# Patient Record
Sex: Male | Born: 1990 | Race: Black or African American | Hispanic: No | Marital: Single | Smoking: Current every day smoker
Health system: Southern US, Community
[De-identification: ages and names within clinical notes are randomized; demographics above are authoritative.]

---

## 2014-11-02 ENCOUNTER — Emergency Department: Payer: Self-pay | Admitting: Emergency Medicine

## 2015-01-25 ENCOUNTER — Ambulatory Visit: Payer: Self-pay

## 2015-02-05 ENCOUNTER — Emergency Department
Admission: EM | Admit: 2015-02-05 | Discharge: 2015-02-05 | Disposition: A | Discharge status: Home / Self Care | Payer: Self-pay | Attending: Emergency Medicine | Admitting: Emergency Medicine

## 2015-02-05 DIAGNOSIS — L0201 Cutaneous abscess of face: Secondary | ICD-10-CM | POA: Insufficient documentation

## 2015-02-05 DIAGNOSIS — L0291 Cutaneous abscess, unspecified: Secondary | ICD-10-CM

## 2015-02-05 MED ORDER — LIDOCAINE-EPINEPHRINE (PF) 1 %-1:200000 IJ SOLN
INTRAMUSCULAR | Status: AC
  Filled 2015-02-05: qty 30

## 2015-02-05 MED ORDER — LIDOCAINE-EPINEPHRINE (PF) 1 %-1:200000 IJ SOLN: 10.0000 mL | Freq: Once | INTRAMUSCULAR | Status: DC

## 2015-02-05 NOTE — ED Notes (Addendum)
Pt reports waking up with bump on left side of neck about 4 hours ago.  Pt reports throbbing pain to area.  Pt denies any difficulty breathing.  Pt denies any past abscesses before.  Area on left neck visibly raised with pustule at apex.  No drainage noted.  Abscess about 2" in diameter when palpated.  Pt reports tenderness upon palpation. Pt NAD at this time.

## 2015-02-05 NOTE — ED Provider Notes (Signed)
[facility] Emergency Department Provider Note    ____________________________________________  Time seen: [id number]  I have reviewed the triage vital signs and the nursing notes.   HISTORY  Chief Complaint Abscess   History limited by: Not Limited   HPI Aaron Green is a 24 y.o. male who presents to the emergency department because of pain and swelling to his left cheek. The patient states that this started last night and has grown progressively worse in size and pain. Patient denies any drainage. Denies any similar symptoms in the past. Denies any recent fevers. Denies any difficulty with breathing or swallowing.     No past medical history on file.  There are no active problems to display for this patient.   No past surgical history on file.  No current outpatient prescriptions on file.  Allergies Review of patient's allergies indicates no known allergies.  No family history on file.  Social History History  Substance Use Topics  . Smoking status: Not on file  . Smokeless tobacco: Not on file  . Alcohol Use: Not on file    Review of Systems  Constitutional: Negative for fever. Cardiovascular: Negative for chest pain. Respiratory: Negative for shortness of breath. Gastrointestinal: Negative for abdominal pain, vomiting and diarrhea. Genitourinary: Negative for dysuria. Musculoskeletal: Negative for back pain. Skin: Negative for rash. Bump and pain to left cheek.  Neurological: Negative for headaches, focal weakness or numbness.   10-point ROS otherwise negative.  ____________________________________________   PHYSICAL EXAM:  VITAL SIGNS: ED Triage Vitals  Enc Vitals Group     BP 02/05/15 0310 141/73 mmHg     Pulse Rate 02/05/15 0310 74     Resp 02/05/15 0310 18     Temp 02/05/15 0310 98.9 F (37.2 C)     Temp Source 02/05/15 0310 Oral     SpO2 02/05/15 0310 99 %     Weight 02/05/15 0310 165 lb (74.844 kg)     Height  02/05/15 0310 5\' 6"  (1.676 m)     Head Cir --      Peak Flow --      Pain Score 02/05/15 0311 6   Constitutional: Alert and oriented. Well appearing and in no distress. Eyes: Conjunctivae are normal. PERRL. Normal extraocular movements. ENT   Head: Normocephalic and atraumatic. Swelling and tenderness to the left cheek roughly around the angle of mandible. There is a pustule at the apex. No active drainage.   Nose: No congestion/rhinnorhea.   Mouth/Throat: Mucous membranes are moist.   Neck: No stridor. Hematological/Lymphatic/Immunilogical: No cervical lymphadenopathy. Cardiovascular: Normal rate Respiratory: Normal respiratory effort without tachypnea nor retractions.  Genitourinary: Deferred Musculoskeletal: Normal range of motion in all extremities. No joint effusions.  No lower extremity tenderness nor edema. Neurologic:  Normal speech and language. No gross focal neurologic deficits are appreciated. Speech is normal.  Skin:  Skin is warm, dry and intact.  Psychiatric: Mood and affect are normal. Speech and behavior are normal. Patient exhibits appropriate insight and judgment.  ____________________________________________    LABS (pertinent positives/negatives)  None  ____________________________________________   EKG  None  ____________________________________________    RADIOLOGY  None  ____________________________________________   PROCEDURES  Procedure(s) performed: I&D, see procedure note(s).  Critical Care performed: No   Incision and Drainage of Abcess Anesthesia Local: 1% Lidocaine with Epi  Prep/Procedure: Skin Prep: Chlorahex Incised abscess with #11 blade Purulent discharge: small Probed to break up loculations Packed with 1/4" gauze Estimated blood loss: <1 ml  ____________________________________________   INITIAL IMPRESSION / ASSESSMENT AND PLAN / ED COURSE  Pertinent labs & imaging results that were available  during my care of the patient were reviewed by me and considered in my medical decision making (see chart for details).  Patient here with bump and swelling to left cheek roughly around the angle of the mandible. Physical exam is consistent with an abscess.  Patient underwent I and D with small amount of purulent discharge. Instructed patient to return in 24 hours for wound recheck.   ____________________________________________   FINAL CLINICAL IMPRESSION(S) / ED DIAGNOSES  Final diagnoses:  Abscess     Phineas SemenGraydon Wolfgang Finigan, MD 02/05/15 [phone]

## 2015-02-05 NOTE — Discharge Instructions (Signed)
In my judgment, in view of the above findings, the patient has a reassuring evaluation and can be safely discharged.Issues concerning treatment and diagnosis were discussed. There were no barriers to understanding. The plan of treatment explanation was well received by the patient and/or family who then verbalized understanding. Please return to the emergency department tomorrow for wound recheck.  Abscess Care After An abscess (also called a boil or furuncle) is an infected area that contains a collection of pus. Signs and symptoms of an abscess include pain, tenderness, redness, or hardness, or you may feel a moveable soft area under your skin. An abscess can occur anywhere in the body. The infection may spread to surrounding tissues causing cellulitis. A cut (incision) by the surgeon was made over your abscess and the pus was drained out. Gauze may have been packed into the space to provide a drain that will allow the cavity to heal from the inside outwards. The boil may be painful for 5 to 7 days. Most people with a boil do not have high fevers. Your abscess, if seen early, may not have localized, and may not have been lanced. If not, another appointment may be required for this if it does not get better on its own or with medications. HOME CARE INSTRUCTIONS   Only take over-the-counter or prescription medicines for pain, discomfort, or fever as directed by your caregiver.  When you bathe, soak and then remove gauze or iodoform packs at least daily or as directed by your caregiver. You may then wash the wound gently with mild soapy water. Repack with gauze or do as your caregiver directs. SEEK IMMEDIATE MEDICAL CARE IF:   You develop increased pain, swelling, redness, drainage, or bleeding in the wound site.  You develop signs of generalized infection including muscle aches, chills, fever, or a general ill feeling.  An oral temperature above 102 F (38.9 C) develops, not controlled by  medication. See your caregiver for a recheck if you develop any of the symptoms described above. If medications (antibiotics) were prescribed, take them as directed. Document Released: 03/29/2005 Document Revised: 12/03/2011 Document Reviewed: 11/24/2007 [facility] Patient Information 2015 [employer] [place]. This information is not intended to replace advice given to you by your health care provider. Make sure you discuss any questions you have with your health care provider.

## 2015-02-05 NOTE — ED Notes (Addendum)
PT with large amt of swelling to left jaw and neck, states woke up this way.  Denies any airway obstruction or shob.

## 2015-02-06 ENCOUNTER — Emergency Department
Admission: EM | Admit: 2015-02-06 | Discharge: 2015-02-06 | Disposition: A | Discharge status: Home / Self Care | Payer: Self-pay | Attending: Emergency Medicine | Admitting: Emergency Medicine

## 2015-02-06 ENCOUNTER — Encounter: Payer: Self-pay | Admitting: *Deleted

## 2015-02-06 DIAGNOSIS — IMO0001 Reserved for inherently not codable concepts without codable children: Secondary | ICD-10-CM

## 2015-02-06 DIAGNOSIS — Z720 Tobacco use: Secondary | ICD-10-CM | POA: Insufficient documentation

## 2015-02-06 DIAGNOSIS — Z4801 Encounter for change or removal of surgical wound dressing: Secondary | ICD-10-CM | POA: Insufficient documentation

## 2015-02-06 DIAGNOSIS — T814XXD Infection following a procedure, subsequent encounter: Secondary | ICD-10-CM

## 2015-02-06 MED ORDER — IBUPROFEN 800 MG PO TABS: 800.0000 mg | ORAL_TABLET | Freq: Three times a day (TID) | ORAL | Status: AC | PRN

## 2015-02-06 MED ORDER — SULFAMETHOXAZOLE-TRIMETHOPRIM 800-160 MG PO TABS: 1.0000 | ORAL_TABLET | Freq: Two times a day (BID) | ORAL | Status: AC

## 2015-02-06 MED ORDER — SULFAMETHOXAZOLE-TRIMETHOPRIM 800-160 MG PO TABS
ORAL_TABLET | ORAL | Status: AC
  Filled 2015-02-06: qty 1

## 2015-02-06 MED ORDER — SULFAMETHOXAZOLE-TRIMETHOPRIM 800-160 MG PO TABS
1.0000 | ORAL_TABLET | Freq: Once | ORAL | Status: AC
  Administered 2015-02-06: 1 via ORAL

## 2015-02-06 NOTE — ED Notes (Signed)
Pt here for abcess recheck on left side of jaw.  Pt was seen in the ER yesterday.

## 2015-02-06 NOTE — ED Provider Notes (Signed)
CSN: [id number]     Arrival date & time 02/06/15  1947 History   First MD Initiated Contact with Patient 02/06/15 2115     Chief Complaint  Patient presents with  . Wound Check     (Consider location/radiation/quality/duration/timing/severity/associated sxs/prior Treatment) HPI patient states he was seen and evaluated yesterday had an I&D landed still has swelling pain and drainage from the area and is here today for reevaluation no other complaints at this time nothing seems to make anything particularly better or worse rates his pain as approximately a 5-6 out of 10  No past medical history on file. No past surgical history on file. No family history on file. History  Substance Use Topics  . Smoking status: Current Every Day Smoker  . Smokeless tobacco: Not on file  . Alcohol Use: No    Review of Systems Negative 6 systems  Constitutional: Negative for fever. Cardiovascular: Negative for chest pain. Respiratory: Negative for shortness of breath. Gastrointestinal: Negative for abdominal pain, vomiting and diarrhea. Genitourinary: Negative for dysuria. Musculoskeletal: Negative for back pain. Skin: Negative for rash. Bump and pain to left cheek.  Neurological: Negative for headaches, focal weakness or numbness.  Allergies  Review of patient's allergies indicates no known allergies.  Home Medications   Prior to Admission medications   Medication Sig Start Date End Date Taking? Authorizing Provider  ibuprofen (ADVIL,MOTRIN) 800 MG tablet Take 1 tablet (800 mg total) by mouth every 8 (eight) hours as needed. 02/06/15   Kimara Bencomo William C Ly Bacchi, PA-C  sulfamethoxazole-trimethoprim (BACTRIM DS,SEPTRA DS) 800-160 MG per tablet Take 1 tablet by mouth 2 (two) times daily. 02/06/15   Rigel Filsinger William C Jonmichael Beadnell, PA-C   BP 128/66 mmHg  Pulse 61  Temp(Src) 97.8 F (36.6 C) (Oral)  Resp 18  Ht 5\' 6"  (1.676 m)  Wt 165 lb (74.844 kg)  BMI 26.64 kg/m2  SpO2 100% Physical Exam  male  appearing stated age well-developed well-nourished distress vitals were reviewed head ears eyes nose neck and throat shows him to have a large abscess swollen and red on his left mandibular angle Lymphatic no adenopathy Heart cardiovascular regular rate and rhythm no murmurs rubs gallops Pulmonary lungs clear to auscultation bilaterally  ED Course  Procedures (including critical care time) wound was reevaluated a large amount of PERRLA material was manually expressed wound was explored no more pus pockets wound was redressed   MDM  Decision making on this patient he had increasing redness  and and drainage around the wound large amount of PERRLA material was expressed on palpation wound was explored and redressed we'll start the patient on Bactrim as well as ibuprofen he is to return in 3 days for wound check if not improved  Final diagnoses:  Wound abscess, subsequent encounter      Akila Batta Rosalyn GessWilliam C Daryel Kenneth, PA-C 02/06/15 2132  Sharyn CreamerMark Quale, MD 02/06/15 2333

## 2015-10-18 ENCOUNTER — Ambulatory Visit: Admission: EM | Admit: 2015-10-18 | Discharge: 2015-10-18 | Discharge status: Absent without leave | Payer: Self-pay

## 2016-03-06 ENCOUNTER — Emergency Department: Payer: Self-pay

## 2016-03-06 ENCOUNTER — Emergency Department
Admission: EM | Admit: 2016-03-06 | Discharge: 2016-03-07 | Disposition: A | Discharge status: Home / Self Care | Payer: Self-pay | Attending: Emergency Medicine | Admitting: Emergency Medicine

## 2016-03-06 DIAGNOSIS — Y9301 Activity, walking, marching and hiking: Secondary | ICD-10-CM | POA: Insufficient documentation

## 2016-03-06 DIAGNOSIS — S46911A Strain of unspecified muscle, fascia and tendon at shoulder and upper arm level, right arm, initial encounter: Secondary | ICD-10-CM | POA: Insufficient documentation

## 2016-03-06 DIAGNOSIS — Y929 Unspecified place or not applicable: Secondary | ICD-10-CM | POA: Insufficient documentation

## 2016-03-06 DIAGNOSIS — F17200 Nicotine dependence, unspecified, uncomplicated: Secondary | ICD-10-CM | POA: Insufficient documentation

## 2016-03-06 DIAGNOSIS — Z79899 Other long term (current) drug therapy: Secondary | ICD-10-CM | POA: Insufficient documentation

## 2016-03-06 DIAGNOSIS — Y999 Unspecified external cause status: Secondary | ICD-10-CM | POA: Insufficient documentation

## 2016-03-06 DIAGNOSIS — Z792 Long term (current) use of antibiotics: Secondary | ICD-10-CM | POA: Insufficient documentation

## 2016-03-06 DIAGNOSIS — W010XXA Fall on same level from slipping, tripping and stumbling without subsequent striking against object, initial encounter: Secondary | ICD-10-CM | POA: Insufficient documentation

## 2016-03-06 NOTE — ED Notes (Signed)
Pt was walking on rocks and he injured his right shoulder, no other complaints.

## 2016-03-06 NOTE — ED Provider Notes (Signed)
[facility] Emergency Department Provider Note ____________________________________________  Time seen: Approximately 11:28 PM  I have reviewed the triage vital signs and the nursing notes.   HISTORY  Chief Complaint Shoulder Pain    HPI Aaron Green is a 25 y.o. male who presents to the emergency department for evaluation of right shoulder pain. He states that while walking on some rocks today about 4:00, he tripped and landed on his outstretched hand. He has had pain in the right shoulder since that time. He is right hand dominant. He denies numbness or tingling in the arm. He denies pain in the elbow, wrist, or hand.  No past medical history on file.  There are no active problems to display for this patient.   No past surgical history on file.  Current Outpatient Rx  Name  Route  Sig  Dispense  Refill  . ibuprofen (ADVIL,MOTRIN) 800 MG tablet   Oral   Take 1 tablet (800 mg total) by mouth every 8 (eight) hours as needed.   30 tablet   0   . naproxen (NAPROSYN) 500 MG tablet   Oral   Take 1 tablet (500 mg total) by mouth 2 (two) times daily with a meal.   30 tablet   0   . sulfamethoxazole-trimethoprim (BACTRIM DS,SEPTRA DS) 800-160 MG per tablet   Oral   Take 1 tablet by mouth 2 (two) times daily.   14 tablet   0     Allergies Review of patient's allergies indicates no known allergies.  No family history on file.  Social History Social History  Substance Use Topics  . Smoking status: Current Every Day Smoker  . Smokeless tobacco: Not on file  . Alcohol Use: No    Review of Systems Constitutional: No recent illness. Cardiovascular: Denies chest pain or palpitations. Respiratory: Denies shortness of breath. Musculoskeletal: Pain in Right shoulder. Skin: Negative for rash, wound, lesion. Neurological: Negative for focal weakness or numbness.  ____________________________________________   PHYSICAL EXAM:  VITAL SIGNS: ED  Triage Vitals  Enc Vitals Group     BP 03/06/16 2220 134/66 mmHg     Pulse Rate 03/06/16 2220 72     Resp 03/06/16 2220 18     Temp 03/06/16 2219 98.6 F (37 C)     Temp Source 03/06/16 2219 Oral     SpO2 03/06/16 2220 98 %     Weight 03/06/16 2219 165 lb (74.844 kg)     Height 03/06/16 2219 5\' 8"  (1.727 m)     Head Cir --      Peak Flow --      Pain Score 03/06/16 2219 8     Pain Loc --      Pain Edu? --      Excl. in GC? --     Constitutional: Alert and oriented. Well appearing and in no acute distress. Eyes: Conjunctivae are normal. EOMI. Head: Atraumatic. Neck: No stridor.  Respiratory: Normal respiratory effort.   Musculoskeletal: Limited range of motion of the right shoulder secondary to pain. Full range of motion of the right elbow, wrist, and hand including all fingers. Tenderness over the proximal humerus and deltoid. Neurologic:  Normal speech and language. No gross focal neurologic deficits are appreciated. Speech is normal. No gait instability. Skin:  Skin is warm, dry and intact. Atraumatic. Psychiatric: Mood and affect are normal. Speech and behavior are normal.  ____________________________________________   LABS (all labs ordered are listed, but only abnormal results are  displayed)  Labs Reviewed - No data to display ____________________________________________  RADIOLOGY  No acute bony abnormality per radiology.  I, Kem Boroughs, personally viewed and evaluated these images (plain radiographs) as part of my medical decision making, as well as reviewing the written report by the radiologist.   ____________________________________________   PROCEDURES  Procedure(s) performed:  Sling applied by ER tech. Patient was neurovascularly intact post-application.   ____________________________________________   INITIAL IMPRESSION / ASSESSMENT AND PLAN / ED COURSE  Pertinent labs & imaging results that were available during my care of the patient were  reviewed by me and considered in my medical decision making (see chart for details).  Patient is to follow-up with orthopedics for symptoms that are not improving over the week. He was instructed to return to emergency room for symptoms that change or worsen. He is given instruction on wearing the sling. He is to wear it while out of bed. He is to remove it while at rest. ____________________________________________   FINAL CLINICAL IMPRESSION(S) / ED DIAGNOSES  Final diagnoses:  Shoulder strain, right, initial encounter       Chinita Pester, FNP 03/07/16 0015  Jennye Moccasin, MD 03/07/16 0030

## 2016-03-07 MED ORDER — NAPROXEN 500 MG PO TABS
500.0000 mg | ORAL_TABLET | Freq: Once | ORAL | Status: AC
  Administered 2016-03-07: 500 mg via ORAL
  Filled 2016-03-07: qty 1

## 2016-03-07 MED ORDER — NAPROXEN 500 MG PO TABS: 500.0000 mg | ORAL_TABLET | Freq: Two times a day (BID) | ORAL | Status: AC

## 2017-09-05 IMAGING — CR DG SHOULDER 2+V*R*
1 series · 3 of 3 positions shown · non-contrast
Comparison: None.

CLINICAL DATA: Status post fall onto outstretched hand, with right
shoulder injury. Initial encounter.

EXAM:
RIGHT SHOULDER - 2+ VIEW

[Series 1: w shoulder external right · 0.14mm/px · 3 of 3 slices shown]
[im 1/3]
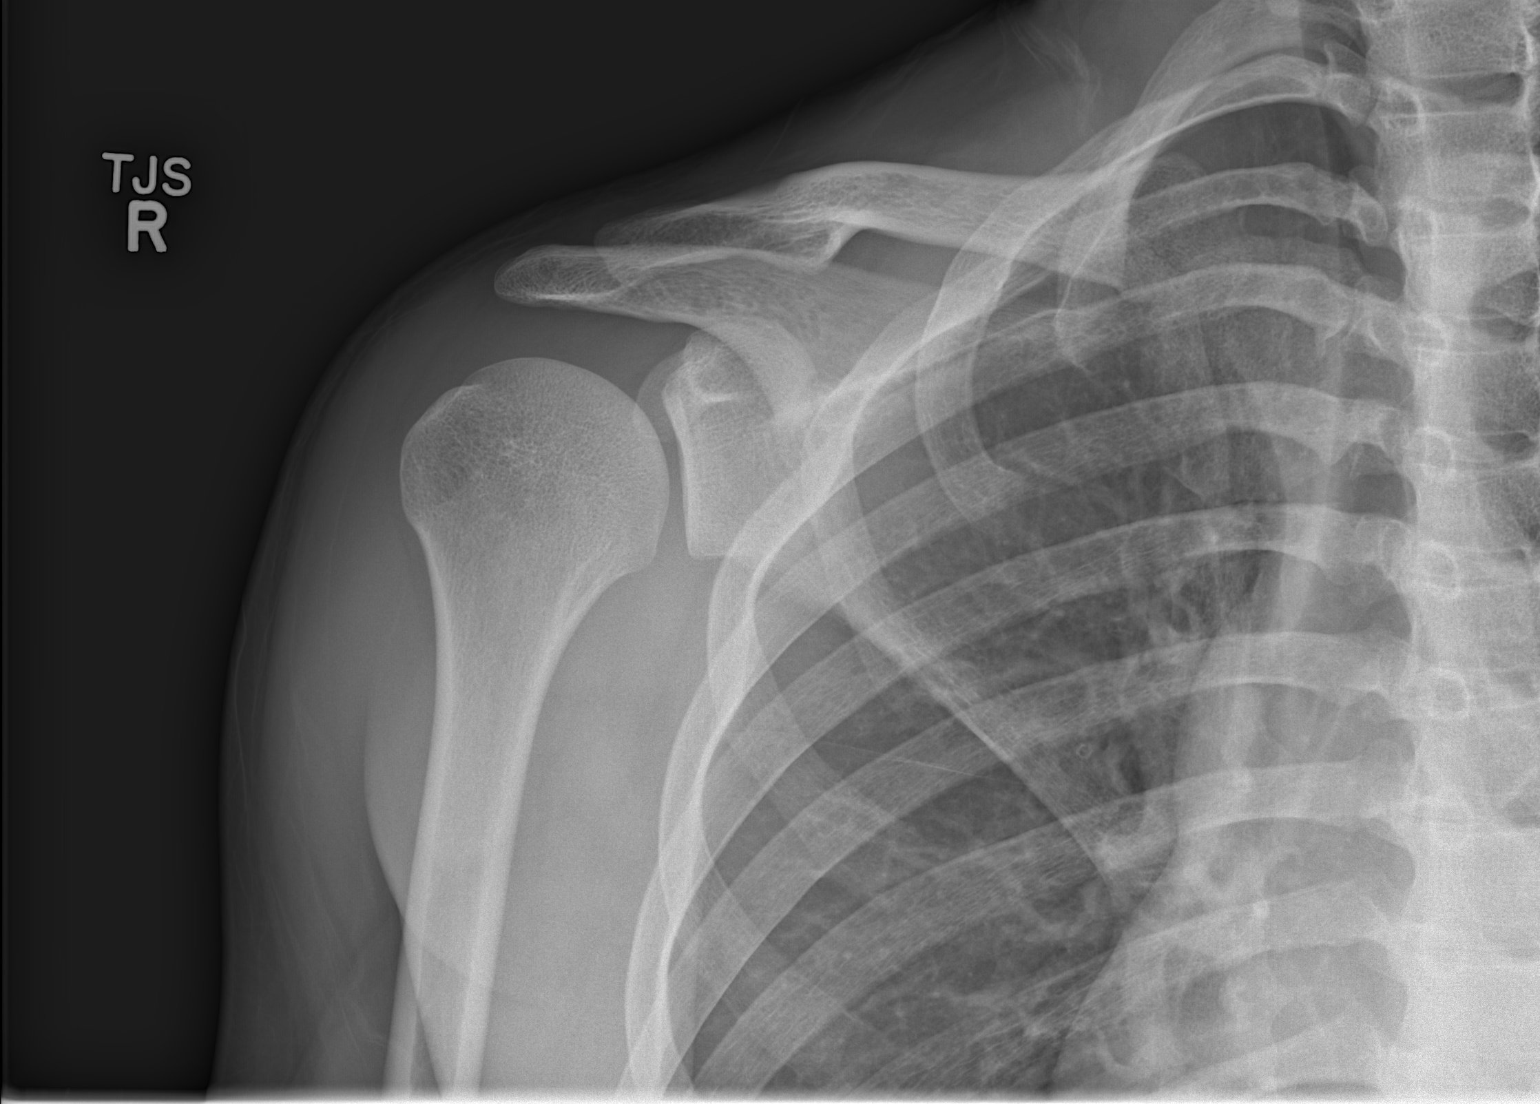
[im 2/3]
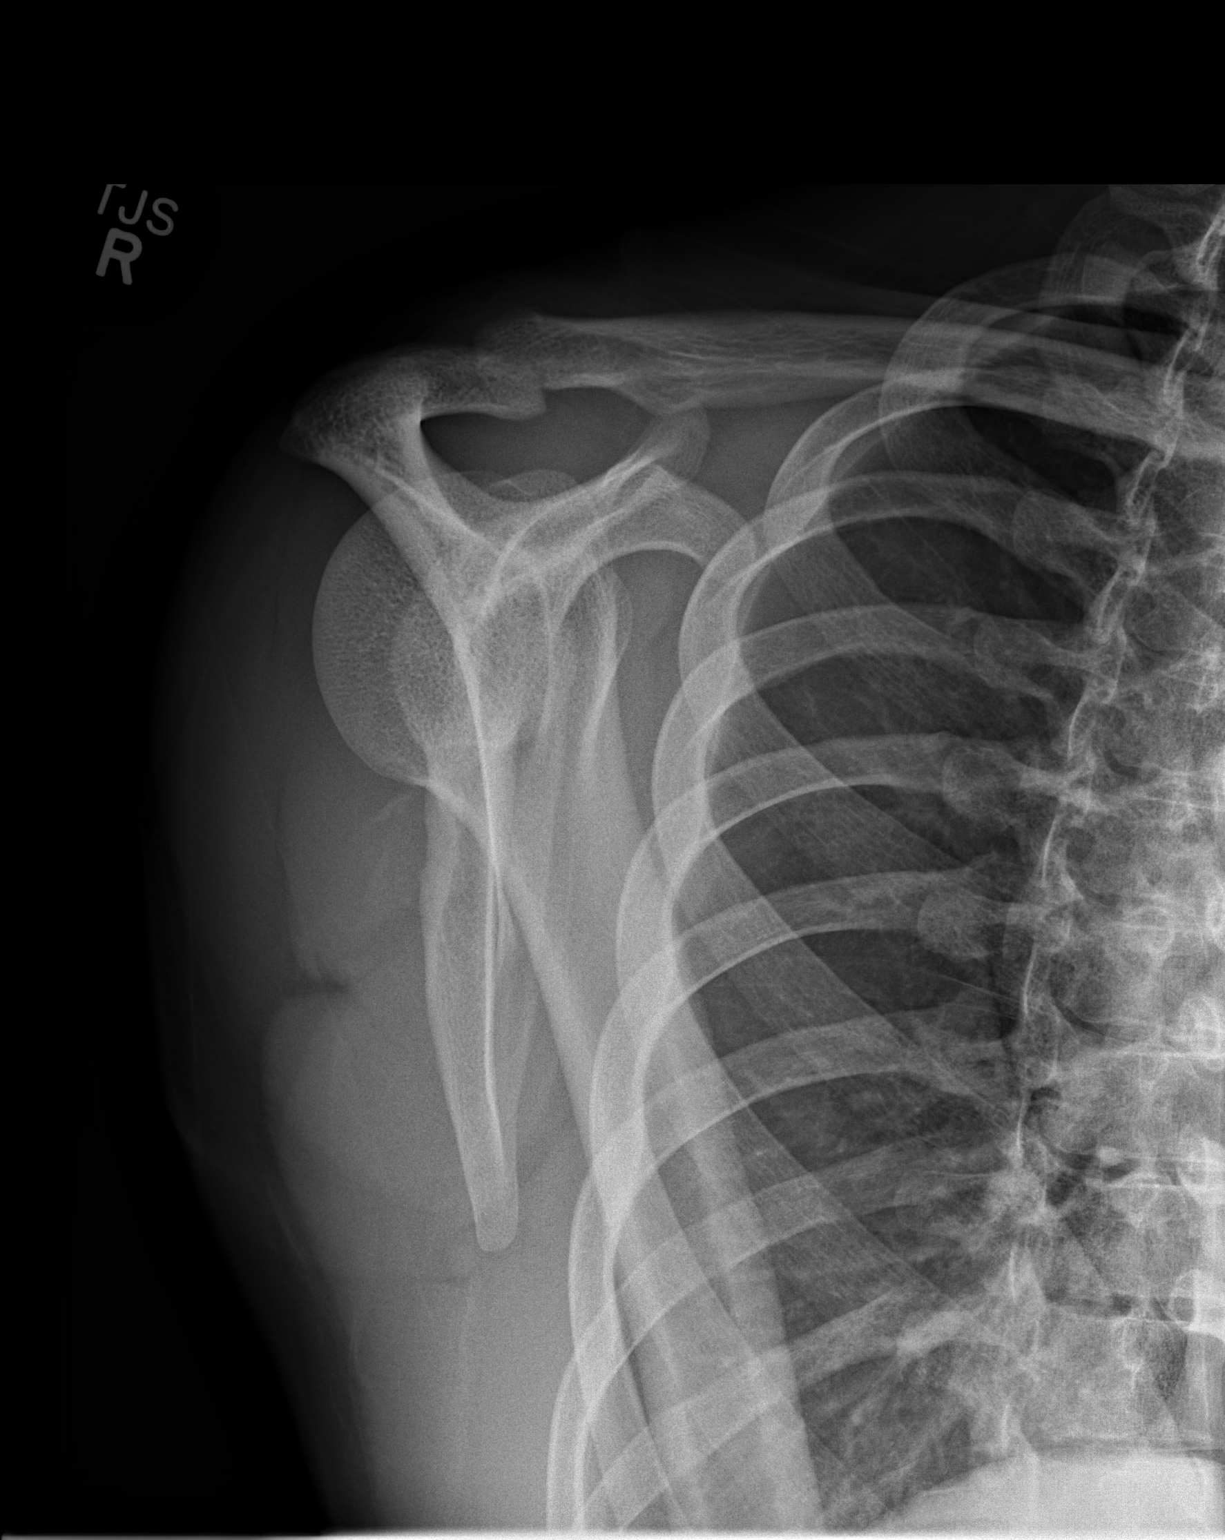
[im 3/3]
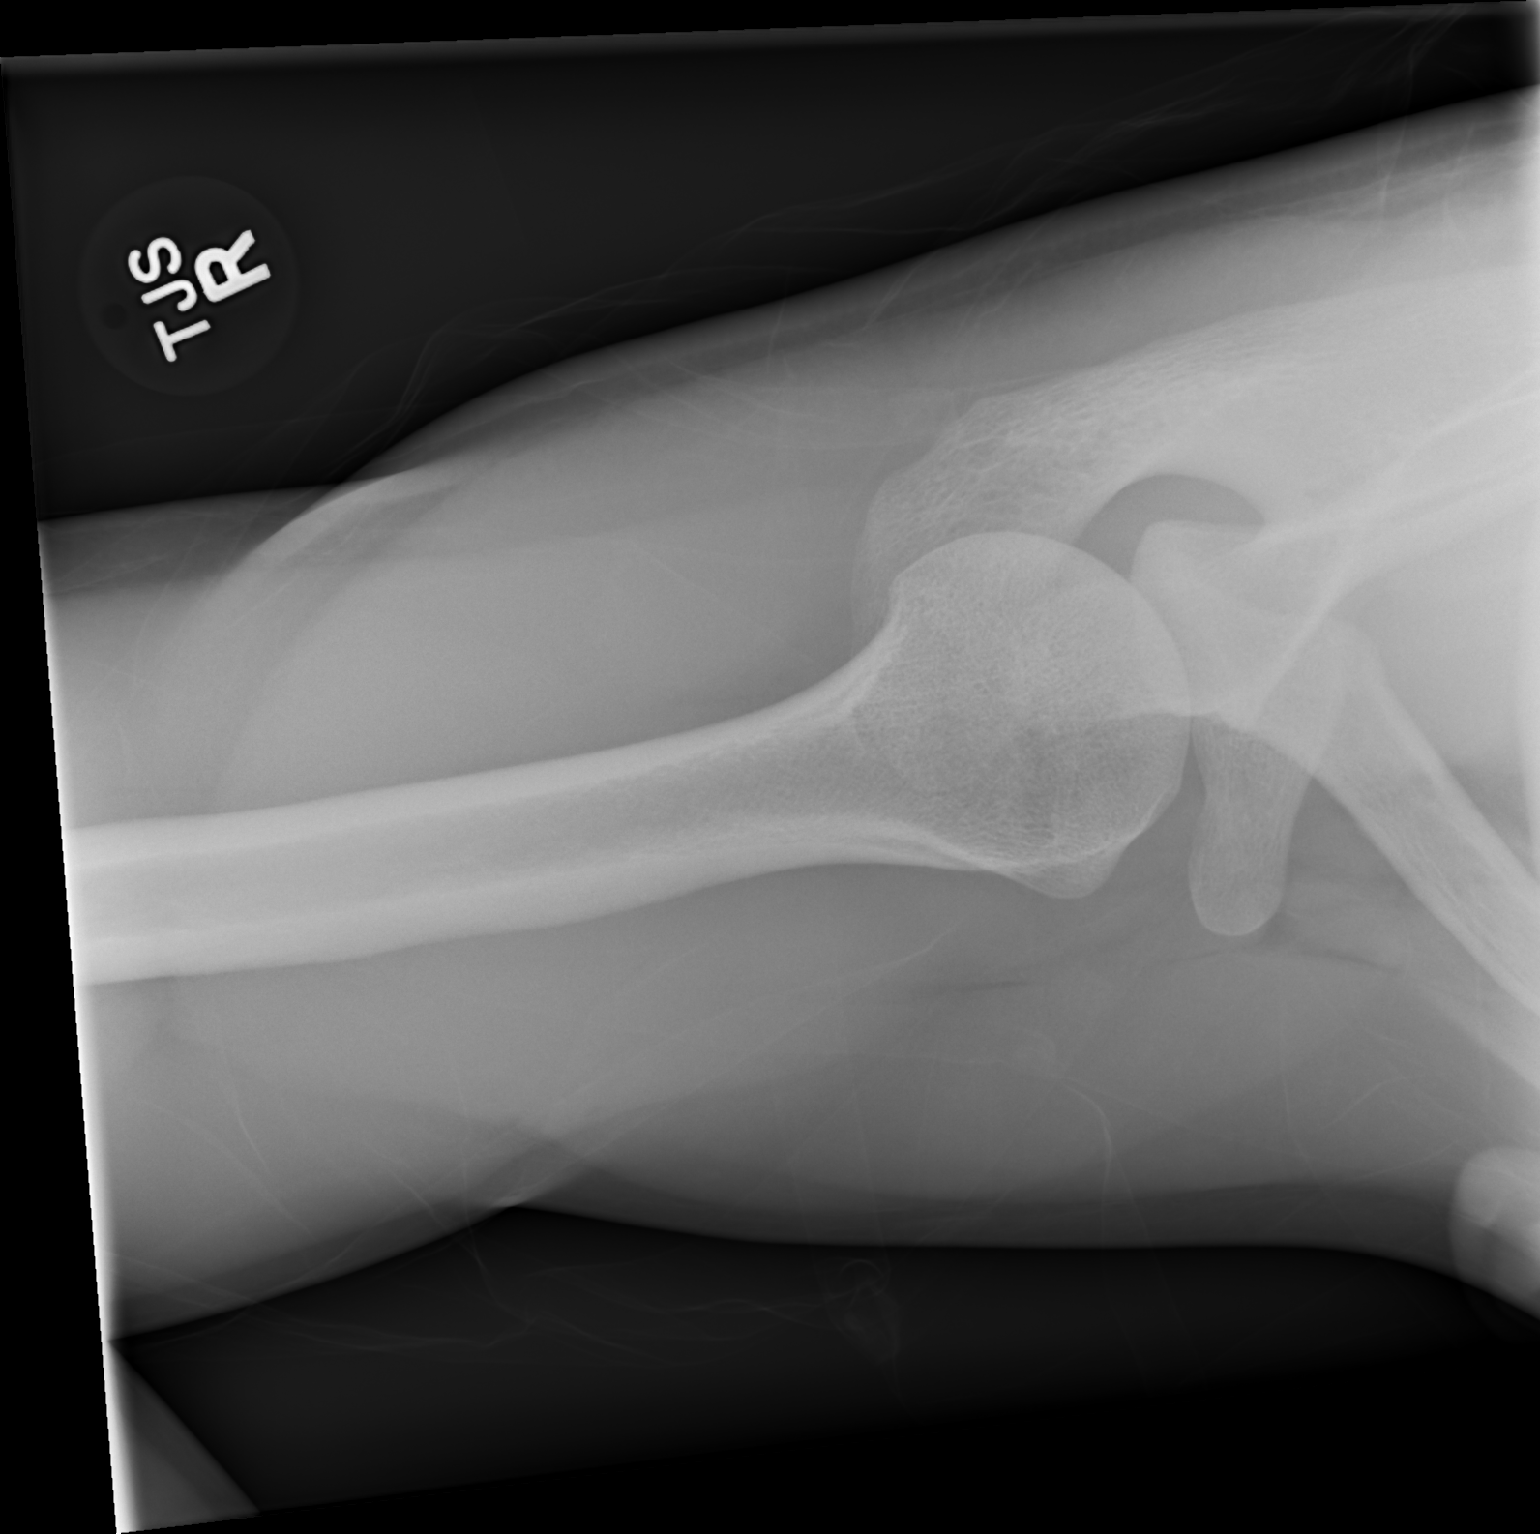

[3 of 3 positions shown; findings below may reference images not displayed]

FINDINGS: There is no evidence of fracture or dislocation. The right humeral
head is seated within the glenoid fossa. The acromioclavicular joint
is unremarkable in appearance. No significant soft tissue
abnormalities are seen. The visualized portions of the right lung
are clear.
IMPRESSION: No evidence of fracture or dislocation.
